# Patient Record
Sex: Male | Born: 2000 | Race: Black or African American | Hispanic: No | Marital: Single | Smoking: Current every day smoker
Health system: Southern US, Community
[De-identification: ages and names within clinical notes are randomized; demographics above are authoritative.]

## PROBLEM LIST (undated history)

## (undated) DIAGNOSIS — Z973 Presence of spectacles and contact lenses: Secondary | ICD-10-CM

## (undated) DIAGNOSIS — F909 Attention-deficit hyperactivity disorder, unspecified type: Secondary | ICD-10-CM

## (undated) HISTORY — PX: MOUTH SURGERY: SHX715

## (undated) HISTORY — PX: HAND SURGERY: SHX662

---
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
## 2019-07-20 ENCOUNTER — Emergency Department (HOSPITAL_COMMUNITY): Admission: EM | Admit: 2019-07-20 | Discharge: 2019-07-21 | Discharge status: Against Medical Advice | Payer: Self-pay

## 2019-07-20 ENCOUNTER — Other Ambulatory Visit: Payer: Self-pay

## 2019-10-13 ENCOUNTER — Ambulatory Visit: Payer: Medicaid Other | Attending: Internal Medicine

## 2019-10-13 DIAGNOSIS — Z20822 Contact with and (suspected) exposure to covid-19: Secondary | ICD-10-CM

## 2019-10-15 LAB — NOVEL CORONAVIRUS, NAA: SARS-CoV-2, NAA: NOT DETECTED

## 2019-10-17 ENCOUNTER — Telehealth: Payer: Self-pay | Admitting: Pediatrics

## 2019-10-17 NOTE — Telephone Encounter (Signed)
Pt aware covid lab test negative, not detected °

## 2020-04-06 ENCOUNTER — Encounter (HOSPITAL_COMMUNITY): Payer: Self-pay

## 2020-04-06 ENCOUNTER — Other Ambulatory Visit: Payer: Self-pay

## 2020-04-06 ENCOUNTER — Emergency Department (HOSPITAL_COMMUNITY)
Admission: EM | Admit: 2020-04-06 | Discharge: 2020-04-06 | Disposition: A | Discharge status: Home / Self Care | Payer: PRIVATE HEALTH INSURANCE | Attending: Emergency Medicine | Admitting: Emergency Medicine

## 2020-04-06 DIAGNOSIS — S79921A Unspecified injury of right thigh, initial encounter: Secondary | ICD-10-CM | POA: Diagnosis present

## 2020-04-06 DIAGNOSIS — F17210 Nicotine dependence, cigarettes, uncomplicated: Secondary | ICD-10-CM | POA: Diagnosis not present

## 2020-04-06 DIAGNOSIS — Y998 Other external cause status: Secondary | ICD-10-CM | POA: Insufficient documentation

## 2020-04-06 DIAGNOSIS — Y9234 Swimming pool (public) as the place of occurrence of the external cause: Secondary | ICD-10-CM | POA: Insufficient documentation

## 2020-04-06 DIAGNOSIS — S76911A Strain of unspecified muscles, fascia and tendons at thigh level, right thigh, initial encounter: Secondary | ICD-10-CM | POA: Insufficient documentation

## 2020-04-06 DIAGNOSIS — X58XXXA Exposure to other specified factors, initial encounter: Secondary | ICD-10-CM | POA: Diagnosis not present

## 2020-04-06 DIAGNOSIS — Y9389 Activity, other specified: Secondary | ICD-10-CM | POA: Insufficient documentation

## 2020-04-06 DIAGNOSIS — T148XXA Other injury of unspecified body region, initial encounter: Secondary | ICD-10-CM

## 2020-04-06 DIAGNOSIS — F17290 Nicotine dependence, other tobacco product, uncomplicated: Secondary | ICD-10-CM | POA: Diagnosis not present

## 2020-04-06 MED ORDER — IBUPROFEN 600 MG PO TABS: 600.0000 mg | ORAL_TABLET | Freq: Four times a day (QID) | ORAL | 0 refills | Status: AC | PRN

## 2020-04-06 NOTE — ED Provider Notes (Signed)
[facility]-EMERGENCY DEPT Provider Note   CSN: [id number] Arrival date & time: 04/06/20  6387     History Chief Complaint  Patient presents with  . Leg Pain    Jonathan [name] is a 19 y.o. male.  HPI   Pt is a 19 y/o M with a h/o ADHD, who presents to the ED today c/o right thigh pain that has been present for the last few days. Pain is intermittent. States pain is worse when he lands on his right foot too hard and puts too much pressure on it. Pain is also worse with certain movements. Currently pain is rated 2-3/10. Has not tried any interventions for sxs. Denies and LE swelling.   Denies leg pain, hemoptysis, recent surgery/trauma, hormone use, personal hx of cancer, or hx of DVT/PE. Had a recent 6 hour trip to [place] in the car. He stopped 2-3 times on the way there.   States he was recently at [employer] and was walking around a lot, swimming, and being very active.    Past Medical History:  Diagnosis Date  . ADHD (attention deficit hyperactivity disorder)   . Wears glasses     There are no problems to display for this patient.   Past Surgical History:  Procedure Laterality Date  . OPEN REDUCTION INTERNAL FIXATION (ORIF) METACARPAL Right 11/06/2015   Procedure: OPEN REDUCTION INTERNAL FIXATION (ORIF) RIGHT RING AND RIGHT SMALL FINGER  METACARPAL FRACTURES;  Surgeon: Dairl Ponder, MD;  Location: [facility];  Service: Orthopedics;  Laterality: Right;       Family History  Problem Relation Age of Onset  . Drug abuse Mother   . Hypertension Mother   . Alcohol abuse Father   . Drug abuse Father   . Asthma Maternal Grandmother   . Hypertension Maternal Grandmother   . Alcohol abuse Maternal Grandfather   . Cancer Paternal Grandmother     Social History   Tobacco Use  . Smoking status: Current Every Day Smoker    Packs/day: 0.15    Types: Cigarettes, Cigars  . Smokeless tobacco: Never Used  Vaping Use  . Vaping Use:  Every day  . Substances: Flavoring  Substance Use Topics  . Alcohol use: No  . Drug use: Yes    Types: Marijuana    Home Medications Prior to Admission medications   Medication Sig Start Date End Date Taking? Authorizing Provider  ibuprofen (ADVIL) 600 MG tablet Take 1 tablet (600 mg total) by mouth every 6 (six) hours as needed for up to 7 days. 04/06/20 04/13/20  Monserrat Vidaurri S, PA-C  ondansetron (ZOFRAN ODT) 4 MG disintegrating tablet 4mg  ODT q4 hours prn nausea/vomit 11/05/17   11/07/17, MD  ondansetron (ZOFRAN) 4 MG tablet Take 1 tablet (4 mg total) by mouth every 6 (six) hours. Patient not taking: Reported on 11/05/2017 04/17/17   06/18/17, PA-C    Allergies    Patient has no known allergies.  Review of Systems   Review of Systems  Constitutional: Negative for fever.  Respiratory: Negative for shortness of breath.   Cardiovascular: Negative for chest pain and leg swelling.  Musculoskeletal:       Right leg pain  Neurological: Negative for weakness and numbness.    Physical Exam Updated Vital Signs BP (!) 141/76 (BP Location: Left Arm)   Pulse 67   Temp 98.2 F (36.8 C) (Oral)   Resp 16   Ht 6\' 1"  (1.854 m)   Wt  79.4 kg   SpO2 100%   BMI 23.09 kg/m   Physical Exam Constitutional:      General: He is not in acute distress.    Appearance: He is well-developed.  Eyes:     Conjunctiva/sclera: Conjunctivae normal.  Cardiovascular:     Rate and Rhythm: Normal rate and regular rhythm.  Pulmonary:     Effort: Pulmonary effort is normal.     Breath sounds: Normal breath sounds.  Musculoskeletal:        General: Normal range of motion.     Right lower leg: No edema.     Left lower leg: No edema.     Comments: TTP to the anterior upper thigh, pain exacerbated with hip flexion. No calf TTP or erythema. 2+ DP pulses. 5/5 strength to BLE.   Skin:    General: Skin is warm and dry.  Neurological:     Mental Status: He is alert and oriented to person,  place, and time.     ED Results / Procedures / Treatments   Labs (all labs ordered are listed, but only abnormal results are displayed) Labs Reviewed - No data to display  EKG None  Radiology No results found.  Procedures Procedures (including critical care time)  Medications Ordered in ED Medications - No data to display  ED Course  I have reviewed the triage vital signs and the nursing notes.  Pertinent labs & imaging results that were available during my care of the patient were reviewed by me and considered in my medical decision making (see chart for details).    MDM Rules/Calculators/A&P                           19 year old male presenting for evaluation of right anterior thigh pain.  Symptoms started [place].  Worse with certain movements and positions.  No unilateral leg swelling or calf tenderness.  Low suspicion for DVT or arterial cause.  Normal pulses.  Normal strength and range of motion.  Suspect muscle strain.  Will give course of anti-inflammatories and have him follow-up with PCP.  Advised on return precautions.  He voices understanding of the plan and reasons to return.  All questions answered.  Patient stable for discharge  Final Clinical Impression(s) / ED Diagnoses Final diagnoses:  Muscle strain    Rx / DC Orders ED Discharge Orders         Ordered    ibuprofen (ADVIL) 600 MG tablet  Every 6 hours PRN     Discontinue  Reprint     04/06/20 0821           Karrie Meres, PA-C 04/06/20 [id number]    Arby Barrette, MD 04/06/20 1030

## 2020-04-06 NOTE — ED Triage Notes (Addendum)
Patient c/o right thigh pain  X 4 days. Patient stated that he injured it but does not know how. "I have been in and out of pools and my whole body is sore."

## 2020-04-06 NOTE — Discharge Instructions (Addendum)

## 2020-06-23 ENCOUNTER — Other Ambulatory Visit: Payer: Self-pay

## 2020-06-23 ENCOUNTER — Emergency Department (HOSPITAL_COMMUNITY): Payer: Self-pay

## 2020-06-23 ENCOUNTER — Emergency Department (HOSPITAL_COMMUNITY)
Admission: EM | Admit: 2020-06-23 | Discharge: 2020-06-23 | Disposition: A | Discharge status: Home / Self Care | Payer: Self-pay | Attending: Emergency Medicine | Admitting: Emergency Medicine

## 2020-06-23 ENCOUNTER — Encounter (HOSPITAL_COMMUNITY): Payer: Self-pay | Admitting: Orthopedic Surgery

## 2020-06-23 DIAGNOSIS — S61402A Unspecified open wound of left hand, initial encounter: Secondary | ICD-10-CM | POA: Insufficient documentation

## 2020-06-23 DIAGNOSIS — S62617A Displaced fracture of proximal phalanx of left little finger, initial encounter for closed fracture: Secondary | ICD-10-CM | POA: Diagnosis not present

## 2020-06-23 DIAGNOSIS — W3400XA Accidental discharge from unspecified firearms or gun, initial encounter: Secondary | ICD-10-CM

## 2020-06-23 DIAGNOSIS — W3409XA Accidental discharge from other specified firearms, initial encounter: Secondary | ICD-10-CM | POA: Insufficient documentation

## 2020-06-23 DIAGNOSIS — Z20822 Contact with and (suspected) exposure to covid-19: Secondary | ICD-10-CM | POA: Diagnosis not present

## 2020-06-23 DIAGNOSIS — S62617B Displaced fracture of proximal phalanx of left little finger, initial encounter for open fracture: Secondary | ICD-10-CM

## 2020-06-23 LAB — SARS CORONAVIRUS 2 BY RT PCR (HOSPITAL ORDER, PERFORMED IN ~~LOC~~ HOSPITAL LAB): SARS Coronavirus 2: NEGATIVE

## 2020-06-23 MED ORDER — CEPHALEXIN 500 MG PO CAPS: 500.0000 mg | ORAL_CAPSULE | Freq: Four times a day (QID) | ORAL | 0 refills | Status: DC

## 2020-06-23 MED ORDER — MORPHINE SULFATE (PF) 4 MG/ML IV SOLN
4.0000 mg | Freq: Once | INTRAVENOUS | Status: AC
  Administered 2020-06-23: 4 mg via INTRAVENOUS
  Filled 2020-06-23: qty 1

## 2020-06-23 MED ORDER — HYDROCODONE-ACETAMINOPHEN 5-325 MG PO TABS: 1.0000 | ORAL_TABLET | ORAL | 0 refills | Status: DC | PRN

## 2020-06-23 MED ORDER — CEFAZOLIN SODIUM-DEXTROSE 2-4 GM/100ML-% IV SOLN
2.0000 g | Freq: Once | INTRAVENOUS | Status: AC
  Administered 2020-06-23: 2 g via INTRAVENOUS
  Filled 2020-06-23: qty 100
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
## 2020-06-23 NOTE — Discharge Instructions (Addendum)
Nothing to eat or drink after midnight.    Go to the outpatient surgical center at 0700 tomorrow morning.
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
## 2020-06-23 NOTE — Progress Notes (Signed)
Orthopedic Tech Progress Note Patient Details:  [name] 11-08-2000 [id number]  Ortho Devices Type of Ortho Device: Ulna gutter splint Ortho Device/Splint Location: LUE Ortho Device/Splint Interventions: Ordered, Application   Post Interventions Patient Tolerated: Well Instructions Provided: Care of device   Donald Pore 06/23/2020, 4:36 PM

## 2020-06-23 NOTE — ED Notes (Signed)
Pain better after morphine

## 2020-06-23 NOTE — Progress Notes (Signed)
PCP - None Cardiologist - n/a  Chest x-ray - n/a EKG - n/a Stress Test - n/a ECHO - n/a Cardiac Cath - n/a  STOP now taking any Aspirin (unless otherwise instructed by your surgeon), Aleve, Naproxen, Ibuprofen, Motrin, Advil, Goody's, BC's, all herbal medications, fish oil, and all vitamins.   Coronavirus Screening Covid test on 06/23/20 was negative.  Patient verbalized understanding of instructions that were given via phone.

## 2020-06-23 NOTE — ED Provider Notes (Signed)
Pt signed out by Dr. Madilyn Hook.  She spoke with Dr. Melvyn Novas who is on for hand.  He recommended irrigation which was done.  He also recommended an ulnar gutter splint which was applied.  Pt given ancef and covid swab has been sent.  Pt is to be NPO after midnight and show up at the surgical center at 0700 tomorrow.  Pt d/c with lortab and keflex.   Jacalyn Lefevre, MD 06/23/20 1626

## 2020-06-24 ENCOUNTER — Ambulatory Visit (HOSPITAL_COMMUNITY)
Admission: RE | Admit: 2020-06-24 | Discharge: 2020-06-24 | Disposition: A | Discharge status: Home / Self Care | Payer: Self-pay | Attending: Orthopedic Surgery | Admitting: Orthopedic Surgery

## 2020-06-24 ENCOUNTER — Encounter (HOSPITAL_COMMUNITY)
Admission: RE | Disposition: A | Discharge status: Home / Self Care | Payer: Self-pay | Source: Home / Self Care | Attending: Orthopedic Surgery

## 2020-06-24 ENCOUNTER — Inpatient Hospital Stay (HOSPITAL_COMMUNITY): Payer: Self-pay | Admitting: Certified Registered Nurse Anesthetist

## 2020-06-24 ENCOUNTER — Inpatient Hospital Stay (HOSPITAL_COMMUNITY): Payer: Self-pay

## 2020-06-24 ENCOUNTER — Encounter (HOSPITAL_COMMUNITY): Payer: Self-pay | Admitting: Orthopedic Surgery

## 2020-06-24 DIAGNOSIS — W3400XA Accidental discharge from unspecified firearms or gun, initial encounter: Secondary | ICD-10-CM | POA: Diagnosis not present

## 2020-06-24 DIAGNOSIS — F17290 Nicotine dependence, other tobacco product, uncomplicated: Secondary | ICD-10-CM | POA: Diagnosis not present

## 2020-06-24 DIAGNOSIS — S62617B Displaced fracture of proximal phalanx of left little finger, initial encounter for open fracture: Secondary | ICD-10-CM | POA: Insufficient documentation

## 2020-06-24 DIAGNOSIS — Z79899 Other long term (current) drug therapy: Secondary | ICD-10-CM | POA: Diagnosis not present

## 2020-06-24 DIAGNOSIS — S61239A Puncture wound without foreign body of unspecified finger without damage to nail, initial encounter: Secondary | ICD-10-CM

## 2020-06-24 DIAGNOSIS — F17200 Nicotine dependence, unspecified, uncomplicated: Secondary | ICD-10-CM | POA: Diagnosis not present

## 2020-06-24 DIAGNOSIS — Y939 Activity, unspecified: Secondary | ICD-10-CM | POA: Diagnosis not present

## 2020-06-24 HISTORY — PX: I & D EXTREMITY: SHX5045

## 2020-06-24 SURGERY — IRRIGATION AND DEBRIDEMENT EXTREMITY
Anesthesia: Regional | Site: Hand | Laterality: Left

## 2020-06-24 MED ORDER — FENTANYL CITRATE (PF) 250 MCG/5ML IJ SOLN
INTRAMUSCULAR | Status: AC
  Filled 2020-06-24: qty 5

## 2020-06-24 MED ORDER — FENTANYL CITRATE (PF) 100 MCG/2ML IJ SOLN
INTRAMUSCULAR | Status: DC | PRN
Start: 2020-06-24 — End: 2020-06-24
  Administered 2020-06-24 (×2): 25 ug via INTRAVENOUS

## 2020-06-24 MED ORDER — BUPIVACAINE HCL (PF) 0.5 % IJ SOLN
INTRAMUSCULAR | Status: DC | PRN
  Administered 2020-06-24: 40 mL via PERINEURAL

## 2020-06-24 MED ORDER — PROPOFOL 500 MG/50ML IV EMUL
INTRAVENOUS | Status: DC | PRN
  Administered 2020-06-24 (×2): 100 ug/kg/min via INTRAVENOUS

## 2020-06-24 MED ORDER — CEFAZOLIN SODIUM-DEXTROSE 2-4 GM/100ML-% IV SOLN
INTRAVENOUS | Status: AC
  Filled 2020-06-24: qty 100

## 2020-06-24 MED ORDER — DEXAMETHASONE SODIUM PHOSPHATE 4 MG/ML IJ SOLN
INTRAMUSCULAR | Status: DC | PRN
  Administered 2020-06-24: 8 mg via PERINEURAL

## 2020-06-24 MED ORDER — CHLORHEXIDINE GLUCONATE 0.12 % MT SOLN: 15.0000 mL | Freq: Once | OROMUCOSAL | Status: AC

## 2020-06-24 MED ORDER — CEFAZOLIN SODIUM-DEXTROSE 2-4 GM/100ML-% IV SOLN
2.0000 g | INTRAVENOUS | Status: AC
  Administered 2020-06-24: 2 g via INTRAVENOUS

## 2020-06-24 MED ORDER — PROPOFOL 10 MG/ML IV BOLUS
INTRAVENOUS | Status: DC | PRN
  Administered 2020-06-24: 40 mg via INTRAVENOUS
  Administered 2020-06-24: 30 mg via INTRAVENOUS

## 2020-06-24 MED ORDER — MIDAZOLAM HCL 2 MG/2ML IJ SOLN
INTRAMUSCULAR | Status: DC | PRN
  Administered 2020-06-24: 2 mg via INTRAVENOUS

## 2020-06-24 MED ORDER — OXYCODONE-ACETAMINOPHEN 10-325 MG PO TABS: 1.0000 | ORAL_TABLET | Freq: Four times a day (QID) | ORAL | 0 refills | Status: AC | PRN

## 2020-06-24 MED ORDER — CHLORHEXIDINE GLUCONATE 0.12 % MT SOLN
OROMUCOSAL | Status: AC
  Administered 2020-06-24: 15 mL via OROMUCOSAL
  Filled 2020-06-24: qty 15

## 2020-06-24 MED ORDER — PROPOFOL 10 MG/ML IV BOLUS
INTRAVENOUS | Status: AC
  Filled 2020-06-24: qty 20

## 2020-06-24 MED ORDER — ORAL CARE MOUTH RINSE: 15.0000 mL | Freq: Once | OROMUCOSAL | Status: AC

## 2020-06-24 MED ORDER — PROMETHAZINE HCL 25 MG/ML IJ SOLN: 6.2500 mg | INTRAMUSCULAR | Status: DC | PRN

## 2020-06-24 MED ORDER — SODIUM CHLORIDE 0.9 % IR SOLN
Status: DC | PRN
  Administered 2020-06-24: 1000 mL

## 2020-06-24 MED ORDER — MIDAZOLAM HCL 2 MG/2ML IJ SOLN
INTRAMUSCULAR | Status: AC
  Filled 2020-06-24: qty 2

## 2020-06-24 MED ORDER — LIDOCAINE HCL (CARDIAC) PF 100 MG/5ML IV SOSY
PREFILLED_SYRINGE | INTRAVENOUS | Status: DC | PRN
  Administered 2020-06-24: 100 mg via INTRAVENOUS

## 2020-06-24 MED ORDER — KETOROLAC TROMETHAMINE 30 MG/ML IJ SOLN: 30.0000 mg | Freq: Once | INTRAMUSCULAR | Status: DC | PRN

## 2020-06-24 MED ORDER — DEXMEDETOMIDINE (PRECEDEX) IN NS 20 MCG/5ML (4 MCG/ML) IV SYRINGE
PREFILLED_SYRINGE | INTRAVENOUS | Status: DC | PRN
  Administered 2020-06-24: 8 ug via INTRAVENOUS
  Administered 2020-06-24: 4 ug via INTRAVENOUS
  Administered 2020-06-24: 8 ug via INTRAVENOUS

## 2020-06-24 MED ORDER — HYDROMORPHONE HCL 1 MG/ML IJ SOLN: 0.2500 mg | INTRAMUSCULAR | Status: DC | PRN

## 2020-06-24 MED ORDER — MEPERIDINE HCL 25 MG/ML IJ SOLN: 6.2500 mg | INTRAMUSCULAR | Status: DC | PRN

## 2020-06-24 MED ORDER — PROPOFOL 1000 MG/100ML IV EMUL
INTRAVENOUS | Status: AC
  Filled 2020-06-24: qty 100

## 2020-06-24 MED ORDER — LACTATED RINGERS IV SOLN: INTRAVENOUS | Status: DC

## 2020-06-24 MED ORDER — CLONIDINE HCL (ANALGESIA) 100 MCG/ML EP SOLN
EPIDURAL | Status: DC | PRN
  Administered 2020-06-24: 100 ug

## 2020-06-24 SURGICAL SUPPLY — 54 items
BNDG COHESIVE 1X5 TAN STRL LF (GAUZE/BANDAGES/DRESSINGS) IMPLANT
BNDG CONFORM 2 STRL LF (GAUZE/BANDAGES/DRESSINGS) IMPLANT
BNDG ELASTIC 3X5.8 VLCR NS LF (GAUZE/BANDAGES/DRESSINGS) ×3 IMPLANT
BNDG ELASTIC 3X5.8 VLCR STR LF (GAUZE/BANDAGES/DRESSINGS) IMPLANT
BNDG ELASTIC 4X5.8 VLCR NS LF (GAUZE/BANDAGES/DRESSINGS) ×3 IMPLANT
BNDG ELASTIC 4X5.8 VLCR STR LF (GAUZE/BANDAGES/DRESSINGS) IMPLANT
BNDG ESMARK 4X9 LF (GAUZE/BANDAGES/DRESSINGS) ×3 IMPLANT
BNDG GAUZE ELAST 4 BULKY (GAUZE/BANDAGES/DRESSINGS) ×3 IMPLANT
CORD BIPOLAR FORCEPS 12FT (ELECTRODE) ×3 IMPLANT
COVER SURGICAL LIGHT HANDLE (MISCELLANEOUS) ×3 IMPLANT
CUFF TOURN SGL QUICK 18X4 (TOURNIQUET CUFF) ×3 IMPLANT
DRAIN PENROSE 1/4X12 LTX STRL (WOUND CARE) IMPLANT
DRAPE C-ARM MINI 42X72 WSTRAPS (DRAPES) ×3 IMPLANT
DRAPE SURG 17X23 STRL (DRAPES) ×3 IMPLANT
DRSG ADAPTIC 3X8 NADH LF (GAUZE/BANDAGES/DRESSINGS) IMPLANT
DRSG XEROFORM 1X8 (GAUZE/BANDAGES/DRESSINGS) ×3 IMPLANT
GAUZE SPONGE 4X4 12PLY STRL (GAUZE/BANDAGES/DRESSINGS) IMPLANT
GAUZE SPONGE 4X4 12PLY STRL LF (GAUZE/BANDAGES/DRESSINGS) ×3 IMPLANT
GAUZE XEROFORM 1X8 LF (GAUZE/BANDAGES/DRESSINGS) IMPLANT
GAUZE XEROFORM 5X9 LF (GAUZE/BANDAGES/DRESSINGS) ×3 IMPLANT
GLOVE BIOGEL PI IND STRL 8.5 (GLOVE) ×1 IMPLANT
GLOVE BIOGEL PI INDICATOR 8.5 (GLOVE) ×2
GLOVE SURG ORTHO 8.0 STRL STRW (GLOVE) ×3 IMPLANT
GOWN STRL REUS W/ TWL XL LVL3 (GOWN DISPOSABLE) ×2 IMPLANT
GOWN STRL REUS W/TWL XL LVL3 (GOWN DISPOSABLE) ×6
HANDPIECE INTERPULSE COAX TIP (DISPOSABLE)
K-WIRE DBL TROCAR .045X4 (WIRE) ×6
KIT BASIN OR (CUSTOM PROCEDURE TRAY) ×3 IMPLANT
KIT TURNOVER KIT B (KITS) ×3 IMPLANT
KWIRE DBL TROCAR .045X4 (WIRE) ×2 IMPLANT
NEEDLE 25GX 5/8IN NON SAFETY (NEEDLE) ×3 IMPLANT
NEEDLE HYPO 25GX1X1/2 BEV (NEEDLE) ×3 IMPLANT
NS IRRIG 1000ML POUR BTL (IV SOLUTION) ×3 IMPLANT
PACK ORTHO EXTREMITY (CUSTOM PROCEDURE TRAY) ×3 IMPLANT
PAD ARMBOARD 7.5X6 YLW CONV (MISCELLANEOUS) ×6 IMPLANT
PAD CAST 3X4 CTTN HI CHSV (CAST SUPPLIES) ×1 IMPLANT
PAD CAST 4YDX4 CTTN HI CHSV (CAST SUPPLIES) IMPLANT
PADDING CAST COTTON 3X4 STRL (CAST SUPPLIES) ×3
PADDING CAST COTTON 4X4 STRL (CAST SUPPLIES)
SET HNDPC FAN SPRY TIP SCT (DISPOSABLE) IMPLANT
SOAP 2 % CHG 4 OZ (WOUND CARE) ×3 IMPLANT
SPLINT FIBERGLASS 3X12 (CAST SUPPLIES) ×3 IMPLANT
SUT PROLENE 4 0 PS 2 18 (SUTURE) ×9 IMPLANT
SUT SUPRAMID 3-0 (SUTURE) ×9 IMPLANT
SUT SUPRAMID 4-0 (SUTURE) ×6 IMPLANT
SUT VIC AB 3-0 PS2 18 (SUTURE) ×3
SUT VIC AB 3-0 PS2 18XBRD (SUTURE) ×1 IMPLANT
TOWEL GREEN STERILE (TOWEL DISPOSABLE) ×3 IMPLANT
TOWEL GREEN STERILE FF (TOWEL DISPOSABLE) ×3 IMPLANT
TUBE CONNECTING 12'X1/4 (SUCTIONS) ×1
TUBE CONNECTING 12X1/4 (SUCTIONS) ×2 IMPLANT
UNDERPAD 30X36 HEAVY ABSORB (UNDERPADS AND DIAPERS) ×3 IMPLANT
WATER STERILE IRR 1000ML POUR (IV SOLUTION) ×3 IMPLANT
YANKAUER SUCT BULB TIP NO VENT (SUCTIONS) ×3 IMPLANT

## 2020-06-24 NOTE — Discharge Instructions (Signed)
KEEP BANDAGE CLEAN AND DRY CALL OFFICE FOR F/U APPT [phone] in 6 days KEEP HAND ELEVATED ABOVE HEART OK TO APPLY ICE TO OPERATIVE AREA CONTACT OFFICE IF ANY WORSENING PAIN OR CONCERNS.  
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
## 2020-06-24 NOTE — Anesthesia Postprocedure Evaluation (Signed)
Anesthesia Post Note  Patient: [name]  Procedure(s) Performed: IRRIGATION AND DEBRIDEMENT EXTREMITY LEFT HAND, PINNING AND REPAIR AS NEEDED. (Left Hand)     Patient location during evaluation: PACU Anesthesia Type: Regional Level of consciousness: awake and alert Pain management: pain level controlled Vital Signs Assessment: post-procedure vital signs reviewed and stable Respiratory status: spontaneous breathing, nonlabored ventilation, respiratory function stable and patient connected to nasal cannula oxygen Cardiovascular status: stable and blood pressure returned to baseline Postop Assessment: no apparent nausea or vomiting Anesthetic complications: no   No complications documented.  Last Vitals:  Vitals:   06/24/20 1137 06/24/20 1152  BP: (!) 109/56 107/65  Pulse: 62 (!) 53  Resp: 14 13  Temp:  (!) 36.2 C  SpO2: 100% 98%    Last Pain:  Vitals:   06/24/20 1152  TempSrc:   PainSc: 0-No pain                 Lewie Loron
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
## 2020-06-24 NOTE — Transfer of Care (Signed)
Immediate Anesthesia Transfer of Care Note  Patient: [name]  Procedure(s) Performed: IRRIGATION AND DEBRIDEMENT EXTREMITY LEFT HAND, PINNING AND REPAIR AS NEEDED. (Left )  Patient Location: PACU  Anesthesia Type:MAC combined with regional for post-op pain  Level of Consciousness: drowsy  Airway & Oxygen Therapy: Patient Spontanous Breathing and Patient connected to face mask oxygen  Post-op Assessment: Report given to RN, Post -op Vital signs reviewed and stable and Patient moving all extremities  Post vital signs: Reviewed and stable  Last Vitals:  Vitals Value Taken Time  BP 92/50 06/24/20 1107  Temp 36.2 C 06/24/20 1107  Pulse 67 06/24/20 1109  Resp 16 06/24/20 1109  SpO2 100 % 06/24/20 1109  Vitals shown include unvalidated device data.  Last Pain:  Vitals:   06/24/20 1107  TempSrc:   PainSc: (P) Asleep      Patients Stated Pain Goal: 5 (05/21/47 1856)  Complications: No complications documented.
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
## 2020-06-24 NOTE — Anesthesia Preprocedure Evaluation (Signed)
Anesthesia Evaluation  Patient identified by MRN, date of birth, ID band Patient awake    Reviewed: Allergy & Precautions, NPO status , Patient's Chart, lab work & pertinent test results  Airway Mallampati: II  TM Distance: >3 FB Neck ROM: Full    Dental no notable dental hx. (+) Dental Advisory Given   Pulmonary neg pulmonary ROS, Current Smoker,    Pulmonary exam normal breath sounds clear to auscultation       Cardiovascular negative cardio ROS Normal cardiovascular exam Rhythm:Regular Rate:Normal     Neuro/Psych negative neurological ROS  negative psych ROS   GI/Hepatic negative GI ROS, Neg liver ROS,   Endo/Other  negative endocrine ROS  Renal/GU negative Renal ROS     Musculoskeletal negative musculoskeletal ROS (+)   Abdominal   Peds  Hematology negative hematology ROS (+)   Anesthesia Other Findings   Reproductive/Obstetrics negative OB ROS                             Anesthesia Physical Anesthesia Plan  ASA: II  Anesthesia Plan: Regional   Post-op Pain Management:    Induction: Intravenous  PONV Risk Score and Plan: 0 and Propofol infusion, TIVA, Treatment may vary due to age or medical condition and Midazolam  Airway Management Planned: Natural Airway  Additional Equipment: None  Intra-op Plan:   Post-operative Plan:   Informed Consent: I have reviewed the patients History and Physical, chart, labs and discussed the procedure including the risks, benefits and alternatives for the proposed anesthesia with the patient or authorized representative who has indicated his/her understanding and acceptance.     Dental advisory given  Plan Discussed with: CRNA  Anesthesia Plan Comments:         Anesthesia Quick Evaluation

## 2020-06-25 ENCOUNTER — Encounter (HOSPITAL_COMMUNITY): Payer: Self-pay | Admitting: Orthopedic Surgery

## 2020-06-26 ENCOUNTER — Encounter (HOSPITAL_COMMUNITY): Payer: Self-pay

## 2020-07-11 ENCOUNTER — Encounter: Payer: Self-pay | Admitting: *Deleted

## 2020-07-11 ENCOUNTER — Other Ambulatory Visit: Payer: Self-pay

## 2020-07-11 ENCOUNTER — Ambulatory Visit: Payer: PRIVATE HEALTH INSURANCE | Attending: Orthopedic Surgery | Admitting: *Deleted

## 2020-07-11 DIAGNOSIS — R278 Other lack of coordination: Secondary | ICD-10-CM

## 2020-07-11 DIAGNOSIS — R600 Localized edema: Secondary | ICD-10-CM | POA: Diagnosis present

## 2020-07-11 DIAGNOSIS — M6281 Muscle weakness (generalized): Secondary | ICD-10-CM | POA: Diagnosis present

## 2020-07-11 DIAGNOSIS — M79642 Pain in left hand: Secondary | ICD-10-CM

## 2020-07-11 DIAGNOSIS — M25542 Pain in joints of left hand: Secondary | ICD-10-CM
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
## 2020-07-11 NOTE — Patient Instructions (Signed)
WEARING SCHEDULE:  Wear splint at ALL times except for hygiene care  Keep hand elevated and dry at all times. Stretch your elbow and shoulder so they don't get stiff.  PURPOSE:  To prevent movement and for protection until injury can heal  CARE OF SPLINT:  Keep splint away from heat sources including: stove, radiator or furnace, or a car in sunlight. The splint can melt and will no longer fit you properly  Keep away from pets and children  Clean the splint with rubbing alcohol as needed.  * During this time, make sure you also clean your hand/arm as instructed by your therapist and/or perform dressing changes as needed. Then dry hand/arm completely before replacing splint. (When cleaning hand/arm, keep it immobilized in same position until splint is replaced)  PRECAUTIONS/POTENTIAL PROBLEMS: *If you notice or experience increased pain, swelling, numbness, or a lingering reddened area from the splint: Contact your therapist immediately by calling [phone]. You must wear the splint for protection, but we will get you scheduled for adjustments as quickly as possible.  (If only straps or hooks need to be replaced and NO adjustments to the splint need to be made, just call the office ahead and let them know you are coming in)  If you have any medical concerns or signs of infection, please call your doctor immediately

## 2020-07-13 ENCOUNTER — Encounter: Payer: PRIVATE HEALTH INSURANCE | Admitting: *Deleted

## 2020-07-25 ENCOUNTER — Ambulatory Visit: Payer: PRIVATE HEALTH INSURANCE | Admitting: *Deleted

## 2020-07-26 ENCOUNTER — Ambulatory Visit: Payer: PRIVATE HEALTH INSURANCE | Admitting: *Deleted

## 2020-08-01 ENCOUNTER — Ambulatory Visit: Payer: PRIVATE HEALTH INSURANCE | Admitting: *Deleted

## 2020-08-01 ENCOUNTER — Other Ambulatory Visit: Payer: Self-pay

## 2020-08-01 ENCOUNTER — Encounter: Payer: Self-pay | Admitting: *Deleted

## 2020-08-01 DIAGNOSIS — M6281 Muscle weakness (generalized): Secondary | ICD-10-CM

## 2020-08-01 DIAGNOSIS — R278 Other lack of coordination: Secondary | ICD-10-CM

## 2020-08-01 DIAGNOSIS — M79642 Pain in left hand: Secondary | ICD-10-CM

## 2020-08-01 DIAGNOSIS — M25542 Pain in joints of left hand: Secondary | ICD-10-CM

## 2020-08-01 DIAGNOSIS — R600 Localized edema: Secondary | ICD-10-CM
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
## 2020-08-01 NOTE — Patient Instructions (Addendum)
DIP Flexion (Active Blocked)    Hold __small____ finger firmly at the middle so that only the tip joint can bend. Hold __5__ seconds. Repeat __10__ times. Do __4-6__ sessions per day.  IP Flexion (Active Blocked)  PIP Flexion (Active Blocked)    Hold large knuckle straight using other hand. Bend middle joint of _small__ finger as far as possible. Hold __5__ seconds. Repeat __10__ times. Do _4-6_ sessions per day.   Flexor Tendon Gliding (Active Straight Fist)    Start with fingers straight. Bend knuckles and middle joints. Keep fingertip joints straight to touch base of palm. Repeat _10___ times. Do __4-6__ sessions per day.     Perform gentle passive and active assistive blocked MCP flexion with PIP extension as reviewed in the clinic today.    Wear splint at work, may remove for an hour at a time at home for 3-4 hours per day (do not lift, push, pull or carry with your left hand when the splint is off).

## 2020-08-07 ENCOUNTER — Ambulatory Visit: Payer: PRIVATE HEALTH INSURANCE | Attending: Orthopedic Surgery | Admitting: Occupational Therapy

## 2020-08-14 ENCOUNTER — Ambulatory Visit: Payer: PRIVATE HEALTH INSURANCE | Admitting: Occupational Therapy

## 2021-11-30 ENCOUNTER — Other Ambulatory Visit: Payer: Self-pay

## 2021-11-30 ENCOUNTER — Emergency Department (HOSPITAL_COMMUNITY)
Admission: EM | Admit: 2021-11-30 | Discharge: 2021-11-30 | Disposition: A | Discharge status: Home / Self Care | Payer: Medicaid Other | Attending: Emergency Medicine | Admitting: Emergency Medicine

## 2021-11-30 ENCOUNTER — Encounter (HOSPITAL_COMMUNITY): Payer: Self-pay

## 2021-11-30 DIAGNOSIS — Y990 Civilian activity done for income or pay: Secondary | ICD-10-CM | POA: Insufficient documentation

## 2021-11-30 DIAGNOSIS — S61111A Laceration without foreign body of right thumb with damage to nail, initial encounter: Secondary | ICD-10-CM | POA: Insufficient documentation

## 2021-11-30 DIAGNOSIS — W290XXA Contact with powered kitchen appliance, initial encounter: Secondary | ICD-10-CM | POA: Insufficient documentation

## 2021-11-30 MED ORDER — CEPHALEXIN 500 MG PO CAPS: 500.0000 mg | ORAL_CAPSULE | Freq: Four times a day (QID) | ORAL | 0 refills | Status: DC

## 2021-11-30 MED ORDER — LIDOCAINE HCL (PF) 1 % IJ SOLN
5.0000 mL | Freq: Once | INTRAMUSCULAR | Status: DC
  Filled 2021-11-30: qty 30

## 2021-11-30 MED ORDER — OXYCODONE-ACETAMINOPHEN 5-325 MG PO TABS: 1.0000 | ORAL_TABLET | Freq: Four times a day (QID) | ORAL | 0 refills | Status: DC | PRN

## 2021-11-30 NOTE — Discharge Instructions (Addendum)
Follow-up with Dr. Izora Ribas.  You can keep the dressing on for the next few days and try and get short-term follow-up.  Take the antibiotics and the pain medicine.

## 2021-11-30 NOTE — ED Triage Notes (Signed)
Patient was at work and cut his right thumb on the end.

## 2021-12-01 NOTE — ED Provider Notes (Signed)
[facility]-EMERGENCY DEPT Provider Note   CSN: [id number] Arrival date & time: 11/30/21  2040     History  Chief Complaint  Patient presents with   Laceration    Jonathan [name] is a 21 y.o. male.   Laceration Patient presents with right thumb injury.  Meat slicer caught the back of his right thumb.  Cuts about midway down the nail and goes to the distal finger.  No bone exposed.  Has had some bleeding.  Tetanus is up-to-date.    Home Medications Prior to Admission medications   Medication Sig Start Date End Date Taking? Authorizing Provider  oxyCODONE-acetaminophen (PERCOCET/ROXICET) 5-325 MG tablet Take 1 tablet by mouth every 6 (six) hours as needed for severe pain. 11/30/21  Yes Benjiman Core, MD  cephALEXin (KEFLEX) 500 MG capsule Take 1 capsule (500 mg total) by mouth 4 (four) times daily. 11/30/21   Benjiman Core, MD  ondansetron (ZOFRAN ODT) 4 MG disintegrating tablet 4mg  ODT q4 hours prn nausea/vomit Patient not taking: Reported on 07/11/2020 11/05/17   11/07/17, MD  ondansetron (ZOFRAN) 4 MG tablet Take 1 tablet (4 mg total) by mouth every 6 (six) hours. Patient not taking: Reported on 11/05/2017 04/17/17   06/18/17, PA-C  Pseudoephedrine-APAP-DM (DAYQUIL PO) Take by mouth. Patient not taking: Reported on 08/01/2020    [provider]      Allergies    Patient has no known allergies.    Review of Systems   Review of Systems  Skin:  Positive for wound.  Neurological:  Negative for weakness and numbness.   Physical Exam Updated Vital Signs BP 140/87    Pulse 80    Temp 98 F (36.7 C) (Oral)    Resp 16    Ht 6' (1.829 m)    Wt 86.2 kg    SpO2 100%    BMI 25.77 kg/m  Physical Exam Musculoskeletal:     Comments: Laceration across mid nail to the distal aspect of the right thumb.  Approximately a centimeter and a half wide and a centimeter long.  No exposed bone.    ED Results / Procedures / Treatments   Labs (all  labs ordered are listed, but only abnormal results are displayed) Labs Reviewed - No data to display  EKG None  Radiology No results found.  Procedures Procedures    Medications Ordered in ED Medications  lidocaine (PF) (XYLOCAINE) 1 % injection 5 mL (has no administration in time range)    ED Course/ Medical Decision Making/ A&P                           Medical Decision Making Risk Prescription drug management.   Patient with laceration from meat slicer to right thumb.  Does go through nail and nailbed although tissue is gone and nothing to close.  Will give pain medicines and some antibiotics.  Digital block to help with pain and examination.  Copiously irrigated.  Will have follow-up with hand surgery.  Will discharge.        Final Clinical Impression(s) / ED Diagnoses Final diagnoses:  Laceration of right thumb without foreign body with damage to nail, initial encounter    Rx / DC Orders ED Discharge Orders          Ordered    cephALEXin (KEFLEX) 500 MG capsule  4 times daily        11/30/21 2228    oxyCODONE-acetaminophen (PERCOCET/ROXICET)  5-325 MG tablet  Every 6 hours PRN        11/30/21 2228              Benjiman Core, MD 12/01/21 0030
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
## 2022-06-23 ENCOUNTER — Other Ambulatory Visit: Payer: Self-pay

## 2022-06-23 ENCOUNTER — Emergency Department (HOSPITAL_COMMUNITY)
Admission: EM | Admit: 2022-06-23 | Discharge: 2022-06-23 | Discharge status: Against Medical Advice | Payer: Self-pay | Attending: Student | Admitting: Student

## 2022-06-23 ENCOUNTER — Encounter (HOSPITAL_COMMUNITY): Payer: Self-pay | Admitting: Emergency Medicine

## 2022-06-23 ENCOUNTER — Emergency Department (HOSPITAL_COMMUNITY)
Admission: EM | Admit: 2022-06-23 | Discharge: 2022-06-23 | Disposition: A | Discharge status: Home / Self Care | Payer: Self-pay | Attending: Emergency Medicine | Admitting: Emergency Medicine

## 2022-06-23 ENCOUNTER — Encounter (HOSPITAL_COMMUNITY): Payer: Self-pay

## 2022-06-23 DIAGNOSIS — Z20822 Contact with and (suspected) exposure to covid-19: Secondary | ICD-10-CM | POA: Insufficient documentation

## 2022-06-23 DIAGNOSIS — Z5321 Procedure and treatment not carried out due to patient leaving prior to being seen by health care provider: Secondary | ICD-10-CM | POA: Insufficient documentation

## 2022-06-23 DIAGNOSIS — J020 Streptococcal pharyngitis: Secondary | ICD-10-CM | POA: Insufficient documentation

## 2022-06-23 DIAGNOSIS — J029 Acute pharyngitis, unspecified: Secondary | ICD-10-CM | POA: Insufficient documentation

## 2022-06-23 LAB — GROUP A STREP BY PCR: Group A Strep by PCR: NOT DETECTED

## 2022-06-23 LAB — SARS CORONAVIRUS 2 BY RT PCR: SARS Coronavirus 2 by RT PCR: NEGATIVE

## 2022-06-23 NOTE — ED Provider Notes (Addendum)
[place] DEPT Provider Note   CSN: [id number] Arrival date & time: 06/23/22  0456     History  Chief Complaint  Patient presents with   Sore Throat    Jonathan [name] is a 21 y.o. male who presents with concern for sore throat for 24 hours. NO cough, though he does have some mild congestion. No known ill contacts, no fevers. Has drank tea at home, no other interventions tried.   Seen in triage at [facility], swabbed for COVID-19 and strep throat, left due to wait times.   Hx of ADHD, no medications daily. No other diagnoses.   HPI     Home Medications Prior to Admission medications   Medication Sig Start Date End Date Taking? Authorizing Provider  cephALEXin (KEFLEX) 500 MG capsule Take 1 capsule (500 mg total) by mouth 4 (four) times daily. 11/30/21   Davonna Belling, MD  ondansetron (ZOFRAN ODT) 4 MG disintegrating tablet 4mg  ODT q4 hours prn nausea/vomit Patient not taking: Reported on 07/11/2020 11/05/17   Malvin Johns, MD  ondansetron (ZOFRAN) 4 MG tablet Take 1 tablet (4 mg total) by mouth every 6 (six) hours. Patient not taking: Reported on 11/05/2017 04/17/17   Montine Circle, PA-C  oxyCODONE-acetaminophen (PERCOCET/ROXICET) 5-325 MG tablet Take 1 tablet by mouth every 6 (six) hours as needed for severe pain. 11/30/21   Davonna Belling, MD  Pseudoephedrine-APAP-DM (DAYQUIL PO) Take by mouth. Patient not taking: Reported on 08/01/2020    [provider]      Allergies    Patient has no known allergies.    Review of Systems   Review of Systems  HENT:  Positive for congestion, postnasal drip and sore throat.   All other systems reviewed and are negative.   Physical Exam Updated Vital Signs BP (!) 140/94 (BP Location: Left Arm)   Pulse 63   Temp 98 F (36.7 C) (Oral)   Resp 17   Ht 5\' 11"  (1.803 m)   Wt 88.5 kg   SpO2 100%   BMI 27.20 kg/m  Physical Exam Vitals and nursing note reviewed.  Constitutional:       Appearance: He is not ill-appearing or toxic-appearing.  HENT:     Head: Normocephalic and atraumatic.     Mouth/Throat:     Palate: No mass.     Pharynx: Oropharynx is clear. Uvula midline. Posterior oropharyngeal erythema present. No pharyngeal swelling or oropharyngeal exudate.     Tonsils: No tonsillar exudate or tonsillar abscesses.     Comments: No sublingual or submental TTP, no evidence of oropharyngeal abscess.  Eyes:     General: No scleral icterus.       Right eye: No discharge.        Left eye: No discharge.     Conjunctiva/sclera: Conjunctivae normal.  Neck:     Trachea: Trachea and phonation normal.  Cardiovascular:     Rate and Rhythm: Normal rate and regular rhythm.  Pulmonary:     Effort: Pulmonary effort is normal. No tachypnea, bradypnea, accessory muscle usage, prolonged expiration, respiratory distress or retractions.     Breath sounds: No stridor, decreased air movement or transmitted upper airway sounds.  Musculoskeletal:     Cervical back: Normal range of motion.  Lymphadenopathy:     Cervical: Cervical adenopathy present.     Right cervical: Superficial cervical adenopathy present.     Left cervical: Superficial cervical adenopathy present.  Skin:    General: Skin is warm and dry.  Neurological:     General: No focal deficit present.     Mental Status: He is alert.  Psychiatric:        Mood and Affect: Mood normal.     ED Results / Procedures / Treatments   Labs (all labs ordered are listed, but only abnormal results are displayed) Labs Reviewed - No data to display  EKG None  Radiology No results found.  Procedures Procedures    Medications Ordered in ED Medications - No data to display  ED Course/ Medical Decision Making/ A&P                           Medical Decision Making 21 year old present with concern for sore throat times less than 24 hours.  Presenting other symptoms normal.  Cardiopulmonary sounds normal.  Oropharyngeal  exam as above without evidence of oropharyngeal abscess but with mild posterior pharyngeal erythema.  Return anterior cervical lymphadenopathy bilaterally, congestion.    Amount and/or Complexity of Data Reviewed Labs:     Details: Strep test negative, COVID test negative   Testing performed at [facility], ED reviewed by this provider, negative. Clinical picture most consistent with acute viral etiology of this patient symptoms.  Clinical concern for more emergent underlying etiology that would warrant further ED work-up or inpatient management is exceedingly low.  No further work-up warranted in the ED at this time.  Recommend symptom management at home.  Jonathan [name]  voiced understanding of his medical evaluation and treatment plan. Each of their questions answered to their expressed satisfaction.  Return precautions were given.  Patient is well-appearing, stable, and was discharged in good condition. This chart was dictated using voice recognition software, Dragon. Despite the best efforts of this provider to proofread and correct errors, errors may still occur which can change documentation meaning.    Final Clinical Impression(s) / ED Diagnoses Final diagnoses:  Strep throat    Rx / DC Orders ED Discharge Orders     None         Paris Lore, PA-C 06/23/22 0548    Rosalynn Sergent, Eugene Gavia, PA-C 06/23/22 0548    Molpus, Jonny Ruiz, MD 06/23/22 [phone]

## 2022-06-23 NOTE — Discharge Instructions (Signed)
You were seen in the ER today for your sore throat. You tested negative for strep and covid-19. You likely have another viral illness causing your symptoms. You may use tyelnol or ibuprofen as needed for your symptoms. Return to the ER with any new severe symptoms.

## 2022-06-23 NOTE — ED Triage Notes (Signed)
Ambulatory to ED with c/o sore throat x 2 days. States it feels similar to last time he had strep - swabbed at [facility] 3 hours ago, was negative.

## 2022-06-23 NOTE — ED Notes (Signed)
Patient called on phone. Patient states he is in parking lot and is coming back in to be seen

## 2022-06-23 NOTE — ED Notes (Signed)
Pt chart accessed due to pt returning to ED for work excuse.

## 2022-06-23 NOTE — ED Triage Notes (Signed)
Pt reported to ED with c/o sore throat x3 days.

## 2022-11-05 ENCOUNTER — Other Ambulatory Visit: Payer: Self-pay

## 2022-11-05 ENCOUNTER — Encounter (HOSPITAL_COMMUNITY): Payer: Self-pay

## 2022-11-05 ENCOUNTER — Emergency Department (HOSPITAL_COMMUNITY)
Admission: EM | Admit: 2022-11-05 | Discharge: 2022-11-05 | Disposition: A | Discharge status: Home / Self Care | Payer: Medicaid Other | Attending: Emergency Medicine | Admitting: Emergency Medicine

## 2022-11-05 DIAGNOSIS — R112 Nausea with vomiting, unspecified: Secondary | ICD-10-CM | POA: Insufficient documentation

## 2022-11-05 DIAGNOSIS — R197 Diarrhea, unspecified: Secondary | ICD-10-CM | POA: Diagnosis not present

## 2022-11-05 DIAGNOSIS — Z1152 Encounter for screening for COVID-19: Secondary | ICD-10-CM | POA: Diagnosis not present

## 2022-11-05 LAB — COMPREHENSIVE METABOLIC PANEL
ALT: 17 U/L (ref 0–44)
AST: 22 U/L (ref 15–41)
Albumin: 5.2 g/dL — ABNORMAL HIGH (ref 3.5–5.0)
Alkaline Phosphatase: 43 U/L (ref 38–126)
Anion gap: 9 (ref 5–15)
BUN: 12 mg/dL (ref 6–20)
CO2: 26 mmol/L (ref 22–32)
Calcium: 9.7 mg/dL (ref 8.9–10.3)
Chloride: 103 mmol/L (ref 98–111)
Creatinine, Ser: 0.97 mg/dL (ref 0.61–1.24)
GFR, Estimated: >60 mL/min (ref >60)
Glucose, Bld: 101 mg/dL — ABNORMAL HIGH (ref 70–99)
Potassium: 4.2 mmol/L (ref 3.5–5.1)
Sodium: 138 mmol/L (ref 135–145)
Total Bilirubin: 1 mg/dL (ref 0.3–1.2)
Total Protein: 8.6 g/dL — ABNORMAL HIGH (ref 6.5–8.1)

## 2022-11-05 LAB — URINALYSIS, ROUTINE W REFLEX MICROSCOPIC
Bacteria, UA: NONE SEEN
Bilirubin Urine: NEGATIVE
Glucose, UA: NEGATIVE mg/dL
Hgb urine dipstick: NEGATIVE
Ketones, ur: 20 mg/dL — AB
Leukocytes,Ua: NEGATIVE
Nitrite: NEGATIVE
Protein, ur: 30 mg/dL — AB
Specific Gravity, Urine: 1.029 (ref 1.005–1.030)
pH: 7 (ref 5.0–8.0)

## 2022-11-05 LAB — LIPASE, BLOOD: Lipase: 30 U/L (ref 11–51)

## 2022-11-05 LAB — CBC
HCT: 52.1 % — ABNORMAL HIGH (ref 39.0–52.0)
Hemoglobin: 16.9 g/dL (ref 13.0–17.0)
MCH: 27.3 pg (ref 26.0–34.0)
MCHC: 32.4 g/dL (ref 30.0–36.0)
MCV: 84.3 fL (ref 80.0–100.0)
Platelets: 188 10*3/uL (ref 150–400)
RBC: 6.18 MIL/uL — ABNORMAL HIGH (ref 4.22–5.81)
RDW: 14.2 % (ref 11.5–15.5)
WBC: 11.1 10*3/uL — ABNORMAL HIGH (ref 4.0–10.5)
nRBC: 0 % (ref 0.0–0.2)

## 2022-11-05 LAB — RESP PANEL BY RT-PCR (RSV, FLU A&B, COVID)  RVPGX2
Influenza A by PCR: NEGATIVE
Influenza B by PCR: NEGATIVE
Resp Syncytial Virus by PCR: NEGATIVE
SARS Coronavirus 2 by RT PCR: NEGATIVE

## 2022-11-05 MED ORDER — ONDANSETRON HCL 4 MG PO TABS: 4.0000 mg | ORAL_TABLET | Freq: Four times a day (QID) | ORAL | 0 refills | Status: DC

## 2022-11-05 MED ORDER — ONDANSETRON 8 MG PO TBDP
8.0000 mg | ORAL_TABLET | Freq: Once | ORAL | Status: AC
  Administered 2022-11-05: 8 mg via ORAL
  Filled 2022-11-05: qty 1

## 2022-11-05 NOTE — ED Notes (Signed)
Ginger ale given for fluid challenge 

## 2022-11-05 NOTE — Discharge Instructions (Signed)
Evaluation for your nausea vomiting diarrhea was overall reassuring.  Symptoms are likely consistent with a foodborne illness.  Recommend that you follow-up with your PCP if your symptoms persist.  Otherwise recommend that you continue conservative treatment at home which includes rehydration as tolerated along with advancing your diet as tolerated.  I have sent Zofran to your pharmacy to treat nausea and vomiting.

## 2022-11-05 NOTE — ED Provider Notes (Signed)
[facility] Provider Note   CSN: [id number] Arrival date & time: 11/05/22  1629     History  Chief Complaint  Patient presents with   Emesis   HPI Jonathan [name] is a 22 y.o. male presenting for nausea vomiting and diarrhea which started early this morning.  Denies bleeding and fever.  States symptoms started all of a sudden.  He is having difficulty "keeping anything down".  States he had a hot dog last night at a cookout and thinks this might be related to symptoms.  Also mention that someone else at the cookout had similar symptoms. Denies abdominal pain.  States he is drinking a sports drink about 4 hours ago and threw it up.  States he just ate some "gummy bear candies" and tolerated them well.  States that he is already "feeling much better".   Emesis Associated symptoms: diarrhea        Home Medications Prior to Admission medications   Medication Sig Start Date End Date Taking? Authorizing Provider  ondansetron (ZOFRAN) 4 MG tablet Take 1 tablet (4 mg total) by mouth every 6 (six) hours. 11/05/22  Yes Harriet Pho, PA-C  cephALEXin (KEFLEX) 500 MG capsule Take 1 capsule (500 mg total) by mouth 4 (four) times daily. 11/30/21   Davonna Belling, MD  ondansetron (ZOFRAN ODT) 4 MG disintegrating tablet 4mg  ODT q4 hours prn nausea/vomit Patient not taking: Reported on 07/11/2020 11/05/17   Malvin Johns, MD  oxyCODONE-acetaminophen (PERCOCET/ROXICET) 5-325 MG tablet Take 1 tablet by mouth every 6 (six) hours as needed for severe pain. 11/30/21   Davonna Belling, MD  Pseudoephedrine-APAP-DM (DAYQUIL PO) Take by mouth. Patient not taking: Reported on 08/01/2020    [provider]      Allergies    Patient has no known allergies.    Review of Systems   Review of Systems  Gastrointestinal:  Positive for diarrhea, nausea and vomiting.    Physical Exam Updated Vital Signs BP 133/78 (BP Location: Left Arm)   Pulse 60    Temp 98.4 F (36.9 C) (Oral)   Resp 18   Ht 6' (1.829 m)   Wt 90.7 kg   SpO2 100%   BMI 27.12 kg/m  Physical Exam Vitals and nursing note reviewed.  HENT:     Head: Normocephalic and atraumatic.     Mouth/Throat:     Mouth: Mucous membranes are moist.  Eyes:     General:        Right eye: No discharge.        Left eye: No discharge.     Conjunctiva/sclera: Conjunctivae normal.  Cardiovascular:     Rate and Rhythm: Normal rate and regular rhythm.     Pulses: Normal pulses.     Heart sounds: Normal heart sounds.  Pulmonary:     Effort: Pulmonary effort is normal.     Breath sounds: Normal breath sounds.  Abdominal:     General: Abdomen is flat.     Palpations: Abdomen is soft.     Tenderness: There is no abdominal tenderness.  Skin:    General: Skin is warm and dry.  Neurological:     General: No focal deficit present.  Psychiatric:        Mood and Affect: Mood normal.     ED Results / Procedures / Treatments   Labs (all labs ordered are listed, but only abnormal results are displayed) Labs Reviewed  COMPREHENSIVE METABOLIC PANEL -  Abnormal; Notable for the following components:      Result Value   Glucose, Bld 101 (*)    Total Protein 8.6 (*)    Albumin 5.2 (*)    All other components within normal limits  CBC - Abnormal; Notable for the following components:   WBC 11.1 (*)    RBC 6.18 (*)    HCT 52.1 (*)    All other components within normal limits  URINALYSIS, ROUTINE W REFLEX MICROSCOPIC - Abnormal; Notable for the following components:   Ketones, ur 20 (*)    Protein, ur 30 (*)    All other components within normal limits  RESP PANEL BY RT-PCR (RSV, FLU A&B, COVID)  RVPGX2  LIPASE, BLOOD    EKG None  Radiology No results found.  Procedures Procedures    Medications Ordered in ED Medications  ondansetron (ZOFRAN-ODT) disintegrating tablet 8 mg (8 mg Oral Given 11/05/22 2013)    ED Course/ Medical Decision Making/ A&P                              Medical Decision Making Amount and/or Complexity of Data Reviewed Labs: ordered.   22 year old male who is well-appearing and hemodynamically stable presenting for nausea vomiting diarrhea that started all of a sudden this morning.  Physical exam was unremarkable, abdomen nontender and soft.  Differential diagnosis for this complaint includes foodborne illness, viral gastroenteritis, other intra-abdominal acute injury, and dehydration and moist mucous membranes.  I personally reviewed and interpreted his labs which revealed mild leukocytosis and hyperglycemia.  Treated nausea and vomiting with Zofran.  Fluid challenge with no complication.  Vitals in the limits for this encounter.  Symptoms likely consistent with a foodborne illness versus viral gastroenteritis.  Intra-abdominal injury is unlikely given no focal tenderness on exam, reassuring labs and vitals.  Advised patient to follow-up with his PCP if symptoms persist.  Discussed return precautions.  Sent Zofran to his pharmacy.  Dehydration also unlikely given normal vitals and moist mucous membranes.         Final Clinical Impression(s) / ED Diagnoses Final diagnoses:  Nausea vomiting and diarrhea    Rx / DC Orders ED Discharge Orders          Ordered    ondansetron (ZOFRAN) 4 MG tablet  Every 6 hours        11/05/22 2038              Harriet Pho, PA-C 11/05/22 2039    Regan Lemming, MD 11/05/22 2311

## 2022-11-05 NOTE — ED Triage Notes (Signed)
Patient said he woke up with abdominal pain all over this morning. Vomited x4 and diarrhea x5.

## 2022-11-05 NOTE — ED Notes (Signed)
Pt tolerated PO challenge

## 2022-11-05 NOTE — ED Provider Triage Note (Signed)
Emergency Medicine Provider Triage Evaluation Note  Jonathan [name] , a 22 y.o. male  was evaluated in triage.  Pt complains of abdominal pain, nausea, vomiting, and diarrhea that started today.  Denies hematochezia, melena, hematoemesis, fever, chills.  No known history of GI problems.   Review of Systems  Positive: As above Negative: As above  Physical Exam  BP 137/85 (BP Location: Left Arm)   Pulse 67   Temp 98.4 F (36.9 C) (Oral)   Resp 19   Ht 6' (1.829 m)   Wt 90.7 kg   SpO2 100%   BMI 27.12 kg/m  Gen:   Awake, no distress   Resp:  Normal effort  MSK:   Moves extremities without difficulty  Other:    Medical Decision Making  Medically screening exam initiated at 5:09 PM.  Appropriate orders placed.  Jonathan [name] was informed that the remainder of the evaluation will be completed by another provider, this initial triage assessment does not replace that evaluation, and the importance of remaining in the ED until their evaluation is complete.     Theressa Stamps R, [place] 11/05/22 [phone]

## 2024-07-12 ENCOUNTER — Encounter (HOSPITAL_COMMUNITY): Payer: Self-pay | Admitting: Emergency Medicine

## 2024-07-12 ENCOUNTER — Ambulatory Visit (HOSPITAL_COMMUNITY)
Admission: EM | Admit: 2024-07-12 | Discharge: 2024-07-12 | Disposition: A | Discharge status: Home / Self Care | Attending: Internal Medicine | Admitting: Internal Medicine

## 2024-07-12 DIAGNOSIS — J020 Streptococcal pharyngitis: Secondary | ICD-10-CM

## 2024-07-12 DIAGNOSIS — J029 Acute pharyngitis, unspecified: Secondary | ICD-10-CM | POA: Diagnosis not present

## 2024-07-12 LAB — POCT RAPID STREP A (OFFICE): Rapid Strep A Screen: POSITIVE — AB

## 2024-07-12 MED ORDER — AMOXICILLIN 500 MG PO CAPS: 500.0000 mg | ORAL_CAPSULE | Freq: Two times a day (BID) | ORAL | 0 refills | Status: AC

## 2024-07-12 MED ORDER — DEXAMETHASONE SODIUM PHOSPHATE 10 MG/ML IJ SOLN
INTRAMUSCULAR | Status: AC
  Filled 2024-07-12: qty 1

## 2024-07-12 MED ORDER — DEXAMETHASONE SODIUM PHOSPHATE 10 MG/ML IJ SOLN
10.0000 mg | Freq: Once | INTRAMUSCULAR | Status: AC
  Administered 2024-07-12: 10 mg via INTRAMUSCULAR

## 2024-07-12 NOTE — Discharge Instructions (Signed)
 You have strep throat.  - Take the prescribed antibiotic as directed for the next 10 days. - Change your toothbrush after 2 to 3 days of antibiotic use to prevent reinfection. - You may also gargle with salt water and drink warm tea with honey to soothe your throat.  You may take ibuprofen 600mg  (3 over the counter 200mg  pills) every 6 hours as needed for pain and inflammation.  You may take tylenol 1,000mg  (2 over the counter extra strength 500mg  pills) every 6 hours as needed for pain.   If you develop any new or worsening symptoms or if your symptoms do not start to improve, please return here or follow-up with your primary care provider. If your symptoms are severe, please go to the emergency room.

## 2024-07-12 NOTE — ED Provider Notes (Signed)
 [facility]    CSN: [id number] Arrival date & time: 07/12/24  1303      History   Chief Complaint Chief Complaint  Patient presents with   Sore Throat    HPI Jonathan [name] is a 23 y.o. male.   Jonathan [name] is a 23 y.o. male presenting for chief complaint of sore throat and chills without documented fever at home that started 3 days ago.  Sore throat is worsened by swallowing.  He feels like his voice is a little bit froggy.  Denies difficulty maintaining secretions.  Denies nausea, vomiting, diarrhea, abdominal pain, rashes, cough, nasal congestion, and recent sick contacts with similar symptoms.  Denies recent antibiotic or steroid use.  History of frequent streptococcal infections as a child, states this feels very similar to the last time he had strep throat.  He has never had a tonsillectomy in the past.  Taking ibuprofen  for pain with minimal relief.   Sore Throat    Past Medical History:  Diagnosis Date   ADHD (attention deficit hyperactivity disorder)    Wears glasses     There are no active problems to display for this patient.   Past Surgical History:  Procedure Laterality Date   HAND SURGERY Right    I & D EXTREMITY Left 06/24/2020   Procedure: IRRIGATION AND DEBRIDEMENT EXTREMITY LEFT HAND, PINNING AND REPAIR AS NEEDED.;  Surgeon: Shari Easter, MD;  Location: [facility];  Service: Orthopedics;  Laterality: Left;   MOUTH SURGERY     at age 39 or 23 yrs old    OPEN REDUCTION INTERNAL FIXATION (ORIF) METACARPAL Right 11/06/2015   Procedure: OPEN REDUCTION INTERNAL FIXATION (ORIF) RIGHT RING AND RIGHT SMALL FINGER  METACARPAL FRACTURES;  Surgeon: Donnice Robinsons, MD;  Location: [facility];  Service: Orthopedics;  Laterality: Right;       Home Medications    Prior to Admission medications   Medication Sig Start Date End Date Taking? Authorizing Provider  amoxicillin (AMOXIL) 500 MG capsule Take 1 capsule (500 mg total) by mouth 2 (two)  times daily for 10 days. 07/12/24 07/22/24 Yes Enedelia Dorna HERO, FNP  ondansetron  (ZOFRAN  ODT) 4 MG disintegrating tablet 4mg  ODT q4 hours prn nausea/vomit Patient not taking: Reported on 07/11/2020 11/05/17   Lenor Hollering, MD  ondansetron  (ZOFRAN ) 4 MG tablet Take 1 tablet (4 mg total) by mouth every 6 (six) hours. Patient not taking: Reported on 07/12/2024 11/05/22   Robinson, John K, PA-C  oxyCODONE -acetaminophen  (PERCOCET/ROXICET) 5-325 MG tablet Take 1 tablet by mouth every 6 (six) hours as needed for severe pain. Patient not taking: Reported on 07/12/2024 11/30/21   Patsey Lot, MD  Pseudoephedrine-APAP-DM (DAYQUIL PO) Take by mouth. Patient not taking: Reported on 08/01/2020    [provider]    Family History Family History  Problem Relation Age of Onset   Drug abuse Mother    Hypertension Mother    Alcohol abuse Father    Drug abuse Father    Asthma Maternal Grandmother    Hypertension Maternal Grandmother    Alcohol abuse Maternal Grandfather    Cancer Paternal Grandmother     Social History Social History   Tobacco Use   Smoking status: Former    Current packs/day: 0.15    Types: Cigars, Cigarettes   Smokeless tobacco: Never   Tobacco comments:    smokes black and mild 2-3 cigars/day  Vaping Use   Vaping status: Never Used  Substance Use Topics  Alcohol use: Yes    Comment: occasional beer   Drug use: Never    Types: Marijuana     Allergies   Patient has no known allergies.   Review of Systems Review of Systems Per HPI  Physical Exam Triage Vital Signs ED Triage Vitals  Encounter Vitals Group     BP 07/12/24 1348 127/76     Girls Systolic BP Percentile --      Girls Diastolic BP Percentile --      Boys Systolic BP Percentile --      Boys Diastolic BP Percentile --      Pulse Rate 07/12/24 1348 90     Resp 07/12/24 1348 18     Temp 07/12/24 1348 99.4 F (37.4 C)     Temp Source 07/12/24 1348 Oral     SpO2 07/12/24 1348 96 %      Weight 07/12/24 1349 195 lb (88.5 kg)     Height 07/12/24 1349 6' (1.829 m)     Head Circumference --      Peak Flow --      Pain Score 07/12/24 1348 8     Pain Loc --      Pain Education --      Exclude from Growth Chart --    No data found.  Updated Vital Signs BP 127/76 (BP Location: Right Arm)   Pulse 90   Temp 99.4 F (37.4 C) (Oral)   Resp 18   Ht 6' (1.829 m)   Wt 195 lb (88.5 kg)   SpO2 96%   BMI 26.45 kg/m   Visual Acuity Right Eye Distance:   Left Eye Distance:   Bilateral Distance:    Right Eye Near:   Left Eye Near:    Bilateral Near:     Physical Exam Vitals and nursing note reviewed.  Constitutional:      Appearance: He is not ill-appearing or toxic-appearing.  HENT:     Head: Normocephalic and atraumatic.     Right Ear: Hearing, tympanic membrane, ear canal and external ear normal.     Left Ear: Hearing, tympanic membrane, ear canal and external ear normal.     Nose: Nose normal.     Mouth/Throat:     Lips: Pink.     Mouth: Mucous membranes are moist. No injury or oral lesions.     Dentition: Normal dentition.     Tongue: No lesions.     Pharynx: Uvula midline. Pharyngeal swelling and uvula swelling present. No oropharyngeal exudate, posterior oropharyngeal erythema or postnasal drip.     Tonsils: No tonsillar exudate or tonsillar abscesses. 2+ on the right. 2+ on the left.     Comments: Voice sounds are minimally muffled.  Maintaining secretions without difficulty.  +2 bilateral tonsillar hypertrophy with uvular swelling and erythema. Eyes:     General: Lids are normal. Vision grossly intact. Gaze aligned appropriately.     Extraocular Movements: Extraocular movements intact.     Conjunctiva/sclera: Conjunctivae normal.  Neck:     Trachea: Trachea and phonation normal.  Pulmonary:     Effort: Pulmonary effort is normal.  Musculoskeletal:     Cervical back: Neck supple.  Lymphadenopathy:     Cervical: Cervical adenopathy present.   Skin:    General: Skin is warm and dry.     Capillary Refill: Capillary refill takes less than 2 seconds.     Findings: No rash.  Neurological:     General: No focal deficit present.  Mental Status: He is alert and oriented to person, place, and time. Mental status is at baseline.     Cranial Nerves: No dysarthria or facial asymmetry.  Psychiatric:        Mood and Affect: Mood normal.        Speech: Speech normal.        Behavior: Behavior normal.        Thought Content: Thought content normal.        Judgment: Judgment normal.      UC Treatments / Results  Labs (all labs ordered are listed, but only abnormal results are displayed) Labs Reviewed  POCT RAPID STREP A (OFFICE) - Abnormal; Notable for the following components:      Result Value   Rapid Strep A Screen Positive (*)    All other components within normal limits    EKG   Radiology No results found.  Procedures Procedures (including critical care time)  Medications Ordered in UC Medications  dexamethasone  (DECADRON ) injection 10 mg (10 mg Intramuscular Given 07/12/24 1420)    Initial Impression / Assessment and Plan / UC Course  I have reviewed the triage vital signs and the nursing notes.  Pertinent labs & imaging results that were available during my care of the patient were reviewed by me and considered in my medical decision making (see chart for details).   1.  Strep throat, sore throat POC strep testing is positive.   Dexamethasone  10 mg IM given for throat pain and swelling. Amoxicillin sent to pharmacy to be taken as prescribed.  OTC medications like tylenol /ibuprofen  as needed for throat pain and fever/chills.   Advised to change toothbrush after 2 to 3 days of antibiotic use to prevent reinfection.   Salt water gargles and warm tea with honey may be used as needed to soothe sore throat.  Excuse note provided.   Counseled patient on potential for adverse effects with medications  prescribed/recommended today, strict ER and return-to-clinic precautions discussed, patient verbalized understanding.    Final Clinical Impressions(s) / UC Diagnoses   Final diagnoses:  Sore throat  Strep throat     Discharge Instructions      You have strep throat.  - Take the prescribed antibiotic as directed for the next 10 days. - Change your toothbrush after 2 to 3 days of antibiotic use to prevent reinfection. - You may also gargle with salt water and drink warm tea with honey to soothe your throat.  You may take ibuprofen  600mg  (3 over the counter 200mg  pills) every 6 hours as needed for pain and inflammation.  You may take tylenol  1,000mg  (2 over the counter extra strength 500mg  pills) every 6 hours as needed for pain.   If you develop any new or worsening symptoms or if your symptoms do not start to improve, please return here or follow-up with your primary care provider. If your symptoms are severe, please go to the emergency room.     ED Prescriptions     Medication Sig Dispense Auth. Provider   amoxicillin (AMOXIL) 500 MG capsule Take 1 capsule (500 mg total) by mouth 2 (two) times daily for 10 days. 20 capsule Enedelia Dorna HERO, FNP      PDMP not reviewed this encounter.   Enedelia Dorna HERO, [place] 07/12/24 [phone]

## 2024-07-12 NOTE — ED Triage Notes (Signed)
 Pt c/o sore throat x's 3 days.  Denies any other symptoms

## 2024-08-20 ENCOUNTER — Encounter (HOSPITAL_COMMUNITY): Payer: Self-pay

## 2024-08-20 ENCOUNTER — Ambulatory Visit (HOSPITAL_COMMUNITY): Admission: EM | Admit: 2024-08-20 | Discharge: 2024-08-20 | Disposition: A | Discharge status: Home / Self Care

## 2024-08-20 DIAGNOSIS — S99922A Unspecified injury of left foot, initial encounter: Secondary | ICD-10-CM | POA: Diagnosis not present

## 2024-08-20 NOTE — Discharge Instructions (Addendum)
 The discoloration of the nail can last for several months It may darken before it starts to lighten to a more green/yellow appearance It's good that you don't have any more pain! Since your nail is a little bit loose I would be cautious to not get it stuck on anything. I recommend to not be barefoot. You can use the coban wrap to protect the nail.

## 2024-08-20 NOTE — ED Triage Notes (Addendum)
 Patient hit the left great toe 2 weeks ago. States the toe nail was half detached. It has gone from green to black according to the Patient. Denies any current pain or trouble moving it.   Patient has not taken anything for pain recently.

## 2024-08-20 NOTE — ED Provider Notes (Signed)
 [facility]    CSN: [id number] Arrival date & time: 08/20/24  1104     History   Chief Complaint Chief Complaint  Patient presents with   Toe Injury    HPI Jonathan [name] is a 23 y.o. male.  2 weeks ago stubbed left great toe. At first was having pain in the toe and under the nail. Was concerned about darkening under the nail after about 5-6 days the pain went away. He has not had any pain since. Wanted to be seen because the dark area under the nail has turned green.  No pain walking or wearing shoes  Past Medical History:  Diagnosis Date   ADHD (attention deficit hyperactivity disorder)    Wears glasses     There are no active problems to display for this patient.   Past Surgical History:  Procedure Laterality Date   HAND SURGERY Right    I & D EXTREMITY Left 06/24/2020   Procedure: IRRIGATION AND DEBRIDEMENT EXTREMITY LEFT HAND, PINNING AND REPAIR AS NEEDED.;  Surgeon: Shari Easter, MD;  Location: [facility];  Service: Orthopedics;  Laterality: Left;   MOUTH SURGERY     at age 58 or 23 yrs old    OPEN REDUCTION INTERNAL FIXATION (ORIF) METACARPAL Right 11/06/2015   Procedure: OPEN REDUCTION INTERNAL FIXATION (ORIF) RIGHT RING AND RIGHT SMALL FINGER  METACARPAL FRACTURES;  Surgeon: Donnice Robinsons, MD;  Location: [facility];  Service: Orthopedics;  Laterality: Right;       Home Medications    Prior to Admission medications   Not on File    Family History Family History  Problem Relation Age of Onset   Drug abuse Mother    Hypertension Mother    Alcohol abuse Father    Drug abuse Father    Asthma Maternal Grandmother    Hypertension Maternal Grandmother    Alcohol abuse Maternal Grandfather    Cancer Paternal Grandmother     Social History Social History   Tobacco Use   Smoking status: Former    Current packs/day: 0.15    Types: Cigars, Cigarettes   Smokeless tobacco: Never   Tobacco comments:    smokes black and mild 2-3  cigars/day  Vaping Use   Vaping status: Never Used  Substance Use Topics   Alcohol use: Yes    Comment: occasional beer   Drug use: Never    Types: Marijuana     Allergies   Patient has no known allergies.   Review of Systems Review of Systems As per HPI  Physical Exam Triage Vital Signs ED Triage Vitals  Encounter Vitals Group     BP 08/20/24 1118 116/64     Girls Systolic BP Percentile --      Girls Diastolic BP Percentile --      Boys Systolic BP Percentile --      Boys Diastolic BP Percentile --      Pulse Rate 08/20/24 1118 66     Resp 08/20/24 1118 16     Temp 08/20/24 1118 97.7 F (36.5 C)     Temp Source 08/20/24 1118 Oral     SpO2 08/20/24 1118 96 %     Weight 08/20/24 1118 196 lb (88.9 kg)     Height 08/20/24 1118 6' (1.829 m)     Head Circumference --      Peak Flow --      Pain Score 08/20/24 1117 0     Pain Loc --  Pain Education --      Exclude from Growth Chart --    No data found.  Updated Vital Signs BP 116/64 (BP Location: Left Arm)   Pulse 66   Temp 97.7 F (36.5 C) (Oral)   Resp 16   Ht 6' (1.829 m)   Wt 196 lb (88.9 kg)   SpO2 96%   BMI 26.58 kg/m    Physical Exam Vitals and nursing note reviewed.  Constitutional:      General: He is not in acute distress.    Appearance: Normal appearance.  HENT:     Mouth/Throat:     Pharynx: Oropharynx is clear.  Cardiovascular:     Rate and Rhythm: Normal rate and regular rhythm.     Heart sounds: Normal heart sounds.  Pulmonary:     Effort: Pulmonary effort is normal.     Breath sounds: Normal breath sounds.  Abdominal:     Palpations: Abdomen is soft.  Feet:     Comments: Left great toenail discoloration. Light green and yellow appearance under the nail. No bony tenderness of toe. The nail is slightly loose but still attached on all 3 sides. Full ROM of toes, distal sensation intact, cap refill < 2 seconds.  Neurological:     Mental Status: He is alert and oriented to person,  place, and time.     UC Treatments / Results  Labs (all labs ordered are listed, but only abnormal results are displayed) Labs Reviewed - No data to display  EKG   Radiology No results found.  Procedures Procedures (including critical care time)  Medications Ordered in UC Medications - No data to display  Initial Impression / Assessment and Plan / UC Course  I have reviewed the triage vital signs and the nursing notes.  Pertinent labs & imaging results that were available during my care of the patient were reviewed by me and considered in my medical decision making (see chart for details).  Discussed subungual hematoma and normal healing process.  Reassurance provided Discussed without bony tenderness or pain with walking we do not need plain films today, however offered for reassurance. Patient declines, agrees not needed. He has no pain on exam and is neurovascularly intact. Advised the nail will hopefully grow out normally, the discoloration should fade but can certainly last several weeks to months Can return if needed Agrees to plan  Final Clinical Impressions(s) / UC Diagnoses   Final diagnoses:  Injury of toenail of left foot, initial encounter     Discharge Instructions      The discoloration of the nail can last for several months It may darken before it starts to lighten to a more green/yellow appearance It's good that you don't have any more pain! Since your nail is a little bit loose I would be cautious to not get it stuck on anything. I recommend to not be barefoot. You can use the coban wrap to protect the nail.     ED Prescriptions   None    PDMP not reviewed this encounter.   Jin Shockley, [place], PA-C 08/20/24 1309
